# Patient Record
Sex: Female | Born: 1965 | Hispanic: No | Smoking: Never smoker
Health system: Southern US, Community
[De-identification: ages and names within clinical notes are randomized; demographics above are authoritative.]

## PROBLEM LIST (undated history)

## (undated) DIAGNOSIS — R87629 Unspecified abnormal cytological findings in specimens from vagina: Secondary | ICD-10-CM

## (undated) DIAGNOSIS — G43909 Migraine, unspecified, not intractable, without status migrainosus: Secondary | ICD-10-CM

## (undated) DIAGNOSIS — E282 Polycystic ovarian syndrome: Secondary | ICD-10-CM

## (undated) HISTORY — PX: MENISCUS REPAIR: SHX5179

## (undated) HISTORY — DX: Migraine, unspecified, not intractable, without status migrainosus: G43.909

## (undated) HISTORY — PX: MOUTH SURGERY: SHX715

## (undated) HISTORY — DX: Unspecified abnormal cytological findings in specimens from vagina: R87.629

## (undated) HISTORY — DX: Polycystic ovarian syndrome: E28.2

## (undated) HISTORY — PX: LAPAROSCOPIC GASTRIC SLEEVE RESECTION: SHX5895

---

## 2017-05-31 ENCOUNTER — Other Ambulatory Visit: Payer: Self-pay | Admitting: Specialist

## 2017-05-31 DIAGNOSIS — Z1239 Encounter for other screening for malignant neoplasm of breast: Secondary | ICD-10-CM

## 2017-06-09 ENCOUNTER — Ambulatory Visit: Payer: Self-pay

## 2017-12-30 ENCOUNTER — Ambulatory Visit (INDEPENDENT_AMBULATORY_CARE_PROVIDER_SITE_OTHER): Payer: BC Managed Care – PPO | Admitting: Obstetrics & Gynecology

## 2017-12-30 ENCOUNTER — Encounter: Payer: Self-pay | Admitting: Obstetrics & Gynecology

## 2017-12-30 VITALS — BP 114/65 | HR 81 | Resp 16 | Ht 64.0 in | Wt 226.0 lb

## 2017-12-30 DIAGNOSIS — Z01419 Encounter for gynecological examination (general) (routine) without abnormal findings: Secondary | ICD-10-CM

## 2017-12-30 DIAGNOSIS — N938 Other specified abnormal uterine and vaginal bleeding: Secondary | ICD-10-CM

## 2017-12-30 DIAGNOSIS — Z1151 Encounter for screening for human papillomavirus (HPV): Secondary | ICD-10-CM

## 2017-12-30 DIAGNOSIS — Z124 Encounter for screening for malignant neoplasm of cervix: Secondary | ICD-10-CM

## 2017-12-30 MED ORDER — IBUPROFEN 800 MG PO TABS: 800.0000 mg | ORAL_TABLET | Freq: Three times a day (TID) | ORAL | 1 refills | Status: DC | PRN

## 2017-12-30 NOTE — Progress Notes (Signed)
Subjective:    Valerie Glover is a 52 y.o. married P0 female who presents for an annual exam. She has PCOS and is having painful cramping all month long. She still has periods but they are random.She is having them monthly but she is having some amount of bleeding most every day.  The patient is not currently sexually active, abstinent for about a year. GYN screening history: last pap: was normal. The patient wears seatbelts: yes. The patient participates in regular exercise: yes. Has the patient ever been transfused or tattooed?: no. The patient reports that there is not domestic violence in her life.   Menstrual History: OB History    Gravida  0   Para  0   Term  0   Preterm  0   AB  0   Living  0     SAB  0   TAB  0   Ectopic  0   Multiple  0   Live Births  0           Menarche age: 61 Patient's last menstrual period was 12/23/2017.    The following portions of the patient's history were reviewed and updated as appropriate: allergies, current medications, past family history, past medical history, past social history, past surgical history and problem list.  Review of Systems Pertinent items are noted in HPI.   Needs colon screening, plans to discuss cologard with her fam med doc Married for 27 years Works as a Runner, broadcasting/film/video at [employer], kindergarten Mammogram due this summer   Objective:    BP 114/65   Pulse 81   Resp 16   Ht  (1.626 m)   Wt 226 lb (102.5 kg)   LMP 12/23/2017   BMI 38.79 kg/m   General Appearance:    Alert, cooperative, no distress, appears stated age  Head:    Normocephalic, without obvious abnormality, atraumatic  Eyes:    PERRL, conjunctiva/corneas clear, EOM's intact, fundi    benign, both eyes  Ears:    Normal TM's and external ear canals, both ears  Nose:   Nares normal, septum midline, mucosa normal, no drainage    or sinus tenderness  Throat:   Lips, mucosa, and tongue normal; teeth and gums normal  Neck:   Supple,  symmetrical, trachea midline, no adenopathy;    thyroid:  no enlargement/tenderness/nodules; no carotid   bruit or JVD  Back:     Symmetric, no curvature, ROM normal, no CVA tenderness  Lungs:     Clear to auscultation bilaterally, respirations unlabored  Chest Wall:    No tenderness or deformity   Heart:    Regular rate and rhythm, S1 and S2 normal, no murmur, rub   or gallop  Breast Exam:    No tenderness, masses, or nipple abnormality  Abdomen:     Soft, non-tender, bowel sounds active all four quadrants,    no masses, no organomegaly  Genitalia:    Normal female without lesion, discharge or tenderness, normal size and shape, anteverted, mobile, non-tender, normal adnexal exam      Extremities:   Extremities normal, atraumatic, no cyanosis or edema  Pulses:   2+ and symmetric all extremities  Skin:   Skin color, texture, turgor normal, no rashes or lesions  Lymph nodes:   Cervical, supraclavicular, and axillary nodes normal  Neurologic:   CNII-XII intact, normal strength, sensation and reflexes    throughout  .    Assessment:    Healthy female exam.  DUB Pelvic pain   Plan:     Thin prep Pap smear. with cotesting IBU 800 mg q4 hours Gyn u/s Come back after u/s

## 2017-12-31 LAB — TSH: TSH: 3.74 mIU/L

## 2018-01-04 LAB — CYTOLOGY - PAP
BACTERIAL VAGINITIS: NEGATIVE
CANDIDA VAGINITIS: NEGATIVE
DIAGNOSIS: NEGATIVE
HPV: NOT DETECTED

## 2018-01-25 ENCOUNTER — Other Ambulatory Visit: Payer: Self-pay

## 2018-01-31 ENCOUNTER — Ambulatory Visit: Payer: Self-pay | Admitting: Obstetrics & Gynecology

## 2018-02-18 ENCOUNTER — Encounter: Payer: Self-pay | Admitting: Obstetrics & Gynecology

## 2018-02-18 ENCOUNTER — Ambulatory Visit (INDEPENDENT_AMBULATORY_CARE_PROVIDER_SITE_OTHER): Payer: BC Managed Care – PPO

## 2018-02-18 DIAGNOSIS — N938 Other specified abnormal uterine and vaginal bleeding: Secondary | ICD-10-CM

## 2018-02-21 ENCOUNTER — Encounter: Payer: Self-pay | Admitting: Obstetrics & Gynecology

## 2018-02-23 ENCOUNTER — Encounter: Payer: Self-pay | Admitting: Obstetrics & Gynecology

## 2018-02-24 ENCOUNTER — Encounter: Payer: Self-pay | Admitting: Obstetrics & Gynecology

## 2018-02-24 ENCOUNTER — Ambulatory Visit: Payer: BC Managed Care – PPO | Admitting: Obstetrics & Gynecology

## 2018-02-24 VITALS — BP 126/78 | HR 66 | Ht 64.0 in | Wt 229.0 lb

## 2018-02-24 DIAGNOSIS — Z01812 Encounter for preprocedural laboratory examination: Secondary | ICD-10-CM

## 2018-02-24 DIAGNOSIS — Z3202 Encounter for pregnancy test, result negative: Secondary | ICD-10-CM | POA: Diagnosis not present

## 2018-02-24 LAB — POCT URINE PREGNANCY: Preg Test, Ur: NEGATIVE

## 2018-02-24 MED ORDER — MISOPROSTOL 200 MCG PO TABS: ORAL_TABLET | ORAL | 0 refills | Status: AC

## 2018-02-25 ENCOUNTER — Encounter (HOSPITAL_COMMUNITY): Payer: Self-pay

## 2018-02-28 ENCOUNTER — Encounter: Payer: Self-pay | Admitting: Obstetrics & Gynecology

## 2018-02-28 ENCOUNTER — Encounter: Payer: Self-pay | Admitting: Gastroenterology

## 2018-03-07 ENCOUNTER — Encounter (HOSPITAL_COMMUNITY): Payer: Self-pay

## 2018-03-11 ENCOUNTER — Encounter (HOSPITAL_COMMUNITY): Payer: BC Managed Care – PPO

## 2018-03-16 ENCOUNTER — Encounter: Payer: Self-pay | Admitting: Obstetrics & Gynecology

## 2018-03-22 ENCOUNTER — Ambulatory Visit (HOSPITAL_COMMUNITY)
Admission: RE | Admit: 2018-03-22 | Payer: BC Managed Care – PPO | Source: Ambulatory Visit | Admitting: Obstetrics & Gynecology

## 2018-03-22 SURGERY — DILATION AND CURETTAGE
Anesthesia: Choice

## 2018-03-23 ENCOUNTER — Other Ambulatory Visit: Payer: Self-pay | Admitting: Obstetrics & Gynecology

## 2018-03-23 MED ORDER — MEFENAMIC ACID 250 MG PO CAPS: ORAL_CAPSULE | ORAL | 3 refills | Status: DC

## 2018-03-23 MED ORDER — DESOGESTREL-ETHINYL ESTRADIOL 0.15-0.02/0.01 MG (21/5) PO TABS: 1.0000 | ORAL_TABLET | Freq: Every day | ORAL | 11 refills | Status: DC

## 2018-03-23 NOTE — Telephone Encounter (Signed)
She called because she is having bloating, sore breasts, not sexually active. She has used OCPs in the past. She is also requesting mefanicic acid (refill given) She mentioned also a hysterectomy and I told her that I didn't believe that was a good answer.  Rec make appt in 3 months.

## 2018-03-29 ENCOUNTER — Ambulatory Visit: Payer: BC Managed Care – PPO | Admitting: Obstetrics & Gynecology

## 2018-04-15 ENCOUNTER — Encounter (HOSPITAL_COMMUNITY): Payer: Self-pay

## 2018-04-29 ENCOUNTER — Encounter: Payer: BC Managed Care – PPO | Admitting: Gastroenterology

## 2018-05-04 ENCOUNTER — Telehealth: Payer: Self-pay

## 2018-05-04 DIAGNOSIS — IMO0001 Reserved for inherently not codable concepts without codable children: Secondary | ICD-10-CM

## 2018-05-04 MED ORDER — NORETHIN-ETH ESTRAD-FE BIPHAS 1 MG-10 MCG / 10 MCG PO TABS: 1.0000 | ORAL_TABLET | Freq: Every day | ORAL | 7 refills | Status: DC

## 2018-05-04 NOTE — Telephone Encounter (Signed)
Left message on pt's phone letting her know that Dr.Dove said it was okay for her to have Lo Loestrin sent it and that I sent it to [facility] on [address] in [place].

## 2018-05-15 ENCOUNTER — Other Ambulatory Visit: Payer: Self-pay | Admitting: Obstetrics & Gynecology

## 2018-05-30 ENCOUNTER — Other Ambulatory Visit: Payer: Self-pay | Admitting: Obstetrics & Gynecology

## 2018-07-05 ENCOUNTER — Ambulatory Visit: Admit: 2018-07-05 | Payer: BC Managed Care – PPO | Admitting: Obstetrics & Gynecology

## 2018-07-05 SURGERY — DILATION AND CURETTAGE
Anesthesia: Choice

## 2018-08-22 IMAGING — US US TRANSVAGINAL NON-OB
1 series · 14 of 25 positions shown · non-contrast
Comparison: None

CLINICAL DATA: Dysfunctional uterine bleeding, intermittent lower
abdominal cramping, bloating and vaginal discharge for 1 year; LMP
12/23/2017, history PCOS

EXAM:
ULTRASOUND PELVIS TRANSVAGINAL
TECHNIQUE: Transvaginal ultrasound examination of the pelvis was performed
including evaluation of the uterus, ovaries, adnexal regions, and
pelvic cul-de-sac. Transabdominal imaging was not ordered.

[Series 1: us transvaginal non-ob · 0.12mm/px · 14 of 40 slices shown]
[im 1/40]
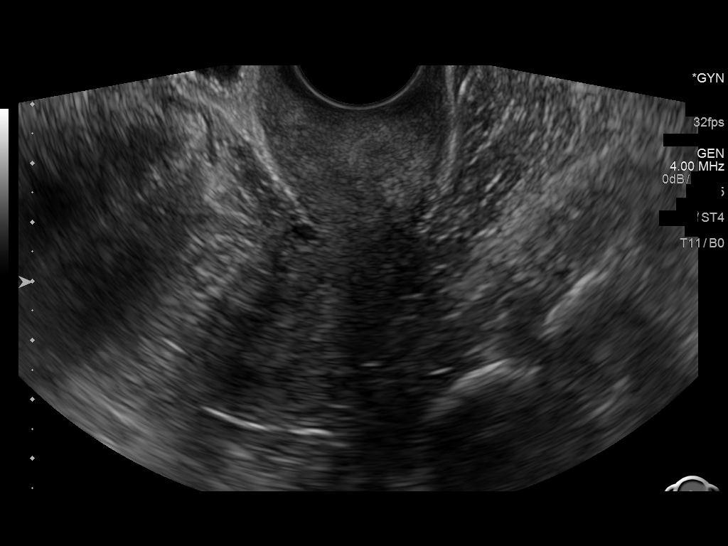
[im 4/40]
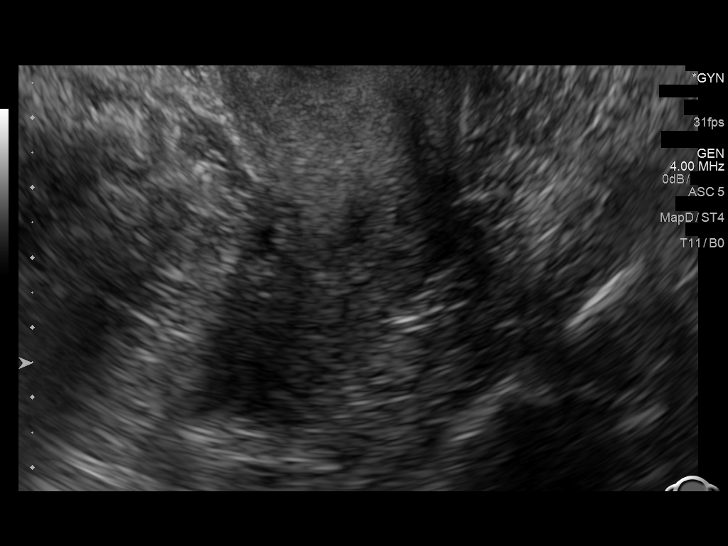
[im 7/40]
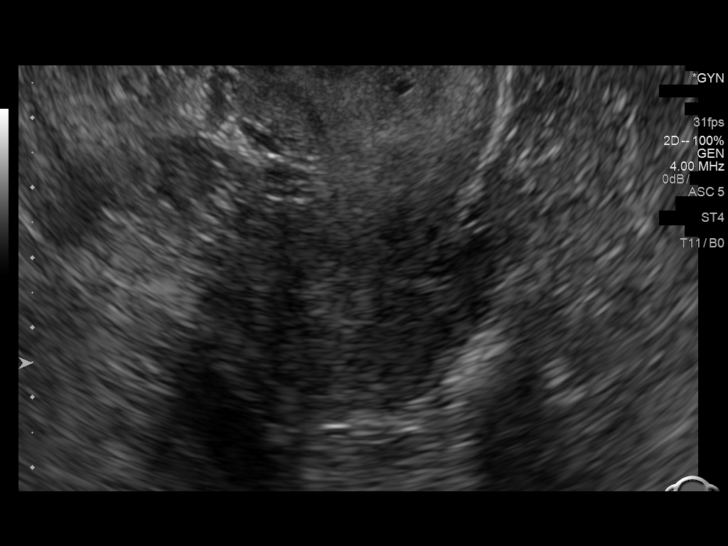
[im 10/40]
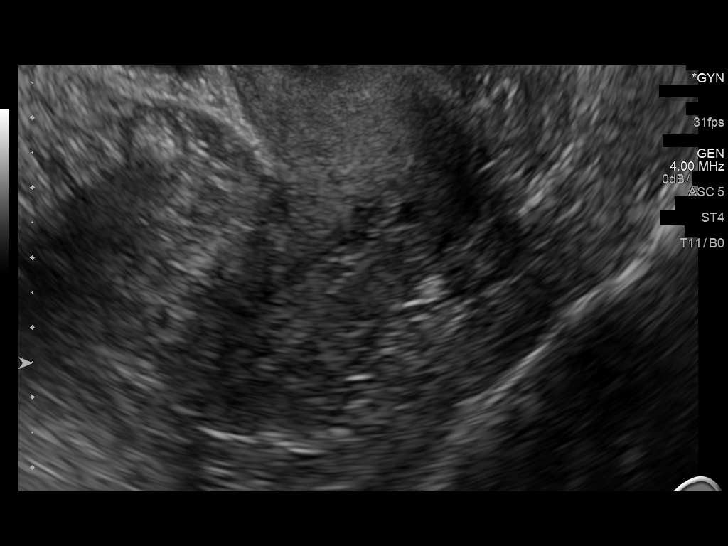
[im 14/40]
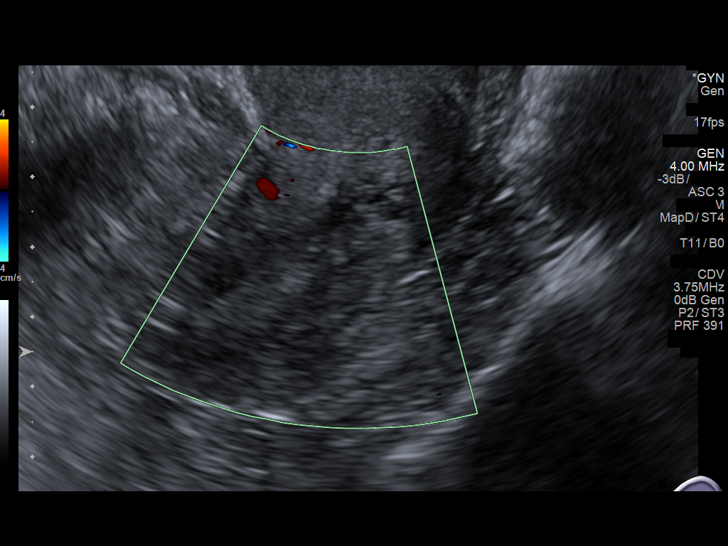
[im 15/40]
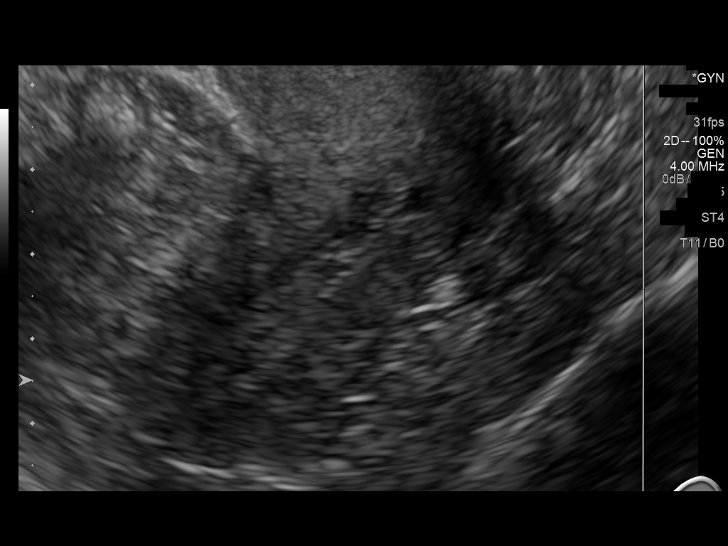
[im 18/40]
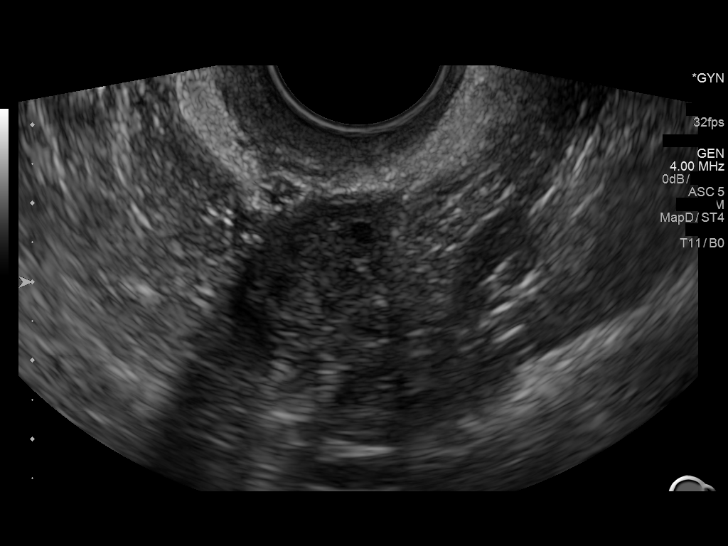
[im 22/40]
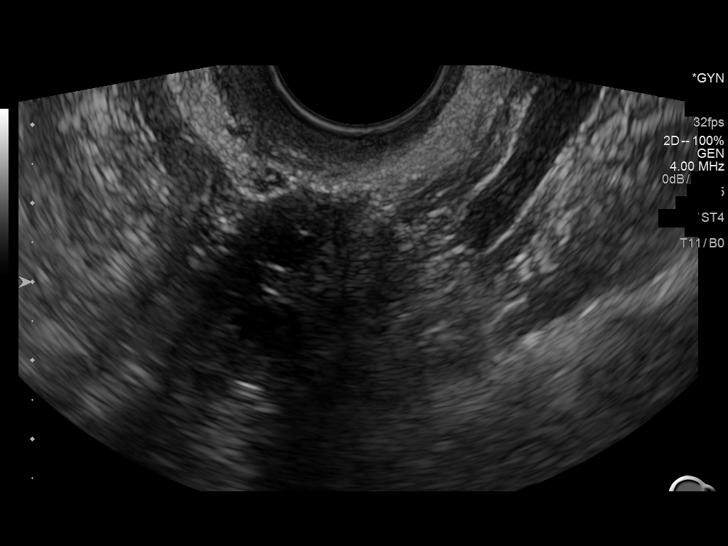
[im 25/40]
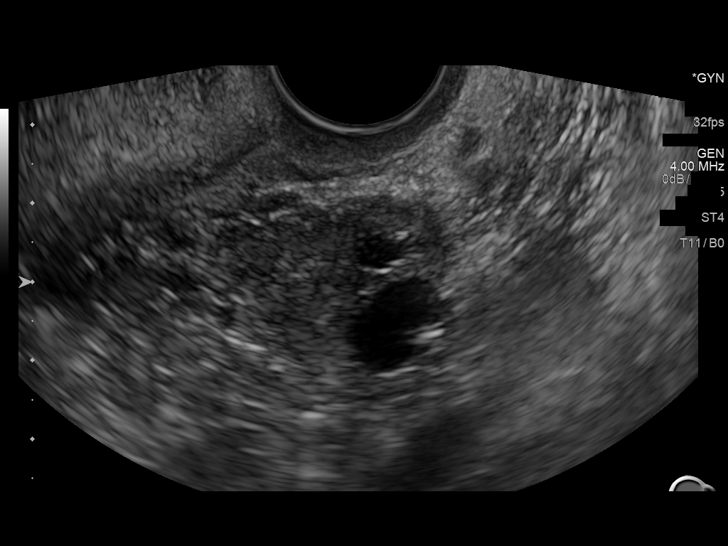
[im 27/40]
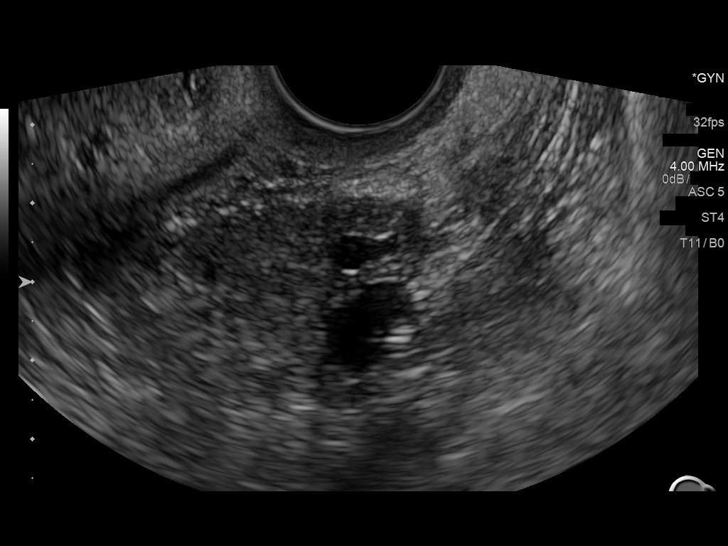
[im 30/40]
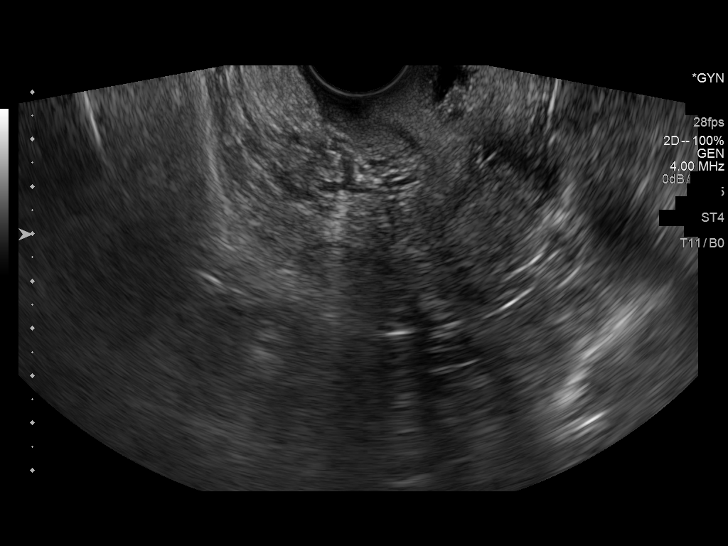
[im 33/40]
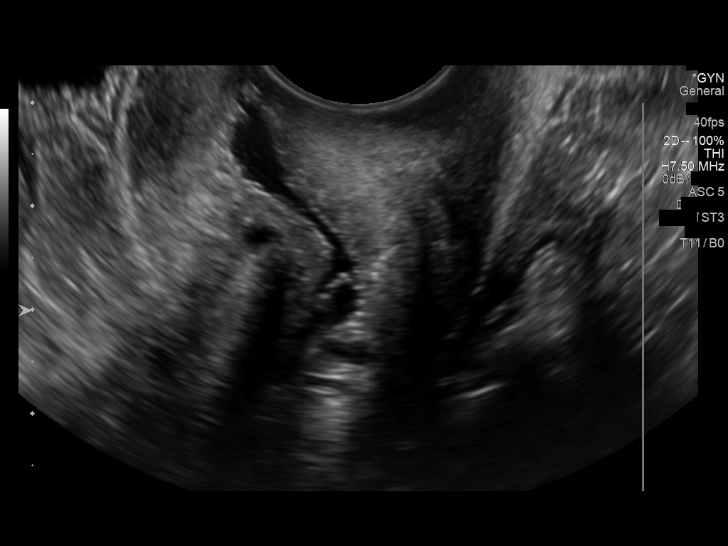
[im 36/40]
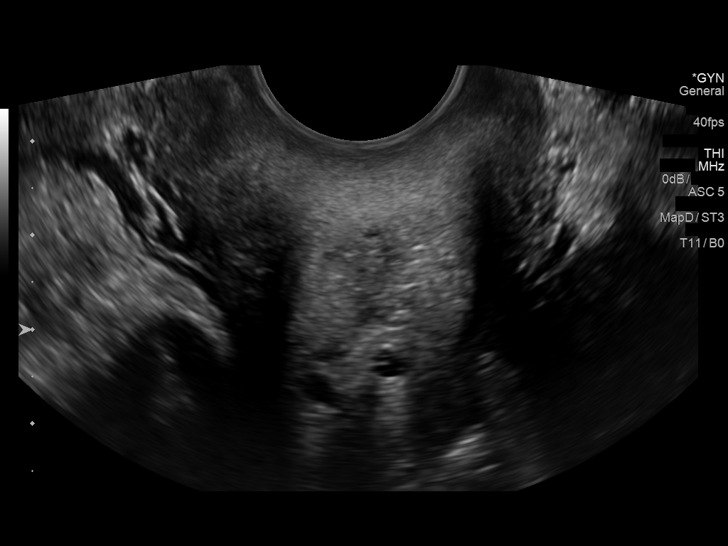
[im 40/40]
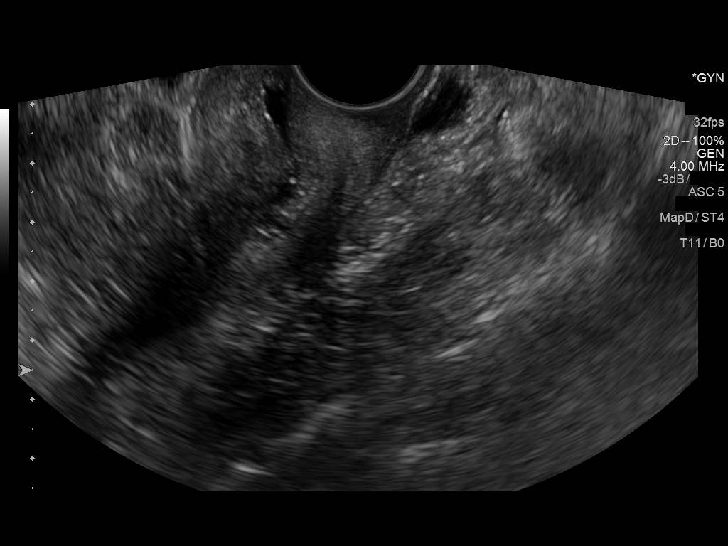

[14 of 25 positions shown; findings below may reference images not displayed]

FINDINGS: Uterus

Measurements: 6.0 x 2.7 x 3.9 cm. Fundus suboptimally visualized due
to position. No gross evidence of uterine mass

Endometrium

Thickness: 8 mm thick. Mildly heterogeneous without definite
endometrial fluid or focal abnormality. Endocervical fluid is noted.

Right ovary

Not visualized question obscured due to obscuration by bowel versus
high position.

Left ovary

Measurements: 2.5 x 1.4 x 3.3 cm.  Normal morphology without mass

Other findings:  No adnexal masses.  Trace free pelvic fluid.
IMPRESSION: Nonspecific endocervical fluid.

Nonvisualization of RIGHT ovary.

Otherwise negative exam.

## 2021-02-07 HISTORY — PX: JOINT REPLACEMENT: SHX530

## 2021-11-08 ENCOUNTER — Emergency Department (INDEPENDENT_AMBULATORY_CARE_PROVIDER_SITE_OTHER)
Admission: EM | Admit: 2021-11-08 | Discharge: 2021-11-08 | Disposition: A | Discharge status: Home / Self Care | Payer: BC Managed Care – PPO | Source: Home / Self Care | Attending: Family Medicine | Admitting: Family Medicine

## 2021-11-08 ENCOUNTER — Encounter: Payer: Self-pay | Admitting: Emergency Medicine

## 2021-11-08 DIAGNOSIS — G43719 Chronic migraine without aura, intractable, without status migrainosus: Secondary | ICD-10-CM

## 2021-11-08 MED ORDER — KETOROLAC TROMETHAMINE 60 MG/2ML IM SOLN
60.0000 mg | Freq: Once | INTRAMUSCULAR | Status: AC
  Administered 2021-11-08: 60 mg via INTRAMUSCULAR

## 2021-11-08 MED ORDER — OXYCODONE-ACETAMINOPHEN 5-325 MG PO TABS
1.0000 | ORAL_TABLET | Freq: Four times a day (QID) | ORAL | 0 refills | Status: AC | PRN
Start: 2021-11-08 — End: ?

## 2021-11-08 NOTE — ED Notes (Signed)
Pt requesting a toradol shot at d/c. RN waiting on availability of provider to review. Pt & husband aware ?

## 2021-11-08 NOTE — ED Triage Notes (Signed)
Hx of migraines  ?Day 20 of HA  ?No  meds today  ?Has spoken with her neurologist  ?Here w/ her husband  ?Denies recent illness ?

## 2021-11-08 NOTE — ED Provider Notes (Signed)
?[facility] ? ? ? ?CSN: [id number] ?Arrival date & time: 11/08/21  1057 ? ? ?  ? ?History   ?Chief Complaint ?Chief Complaint  ?Patient presents with  ? Migraine  ? ? ?HPI ?Valerie Glover is a 56 y.o. female.  ? ?HPI ? ?56 year old woman with a history of chronic migraines.  Has seen neurology a multitude of times over the years.  Is not responding to her current migraine treatments.  She is on the Aimovig injections, has Phenergan for nausea and vomiting, has Imitrex to take for headaches, Topamax to take to prevent headaches, and Zanaflex to take for tension.  In spite of this she has a migraine that is been going on for almost 2 weeks.  She saw her neurologist on 10/29/2021 and was given an injection of Toradol, an injection of Zofran and an injection of Decadron with steroids to take for a few days.  This helped the headache but did not make it go away.  The headache is back today.  She is here with her husband ? ?Past Medical History:  ?Diagnosis Date  ? Migraines   ? PCO (polycystic ovaries)   ? Vaginal Pap smear, abnormal   ? ? ?There are no problems to display for this patient. ? ? ?Past Surgical History:  ?Procedure Laterality Date  ? JOINT REPLACEMENT Left 02/2021  ? LAPAROSCOPIC GASTRIC SLEEVE RESECTION    ? MENISCUS REPAIR    ? MOUTH SURGERY    ? ? ?OB History   ? ? Gravida  ?0  ? Para  ?0  ? Term  ?0  ? Preterm  ?0  ? AB  ?0  ? Living  ?0  ?  ? ? SAB  ?0  ? IAB  ?0  ? Ectopic  ?0  ? Multiple  ?0  ? Live Births  ?0  ?   ?  ?  ? ? ? ?Home Medications   ? ?Prior to Admission medications   ?Medication Sig Start Date End Date Taking? Authorizing Provider  ?Cholecalciferol 50 MCG (2000 UT) CAPS PLEASE SEE ATTACHED FOR DETAILED DIRECTIONS 01/21/21  Yes [provider]  ?promethazine (PHENERGAN) 25 MG tablet Take by mouth. 07/21/21  Yes [provider]  ?tiZANidine (ZANAFLEX) 4 MG tablet Take by mouth 3 (three) times daily. 12/06/20  Yes [provider]  ?AIMOVIG 140 DOSE 70  MG/ML SOAJ INJECT 2 MLS (140 MG TOTAL) INTO THE SKIN EVERY 30 DAYS. 12/06/17   [provider]  ?divalproex (DEPAKOTE) 500 MG DR tablet Take 500 mg by mouth 3 (three) times daily.    [provider]  ?escitalopram (LEXAPRO) 10 MG tablet Take 10 mg by mouth daily.    [provider]  ?misoprostol (CYTOTEC) 200 MCG tablet Take 3 pills by mouth the night before biopsy. 02/24/18   Allie Bossier, MD  ?oxyCODONE-acetaminophen (PERCOCET/ROXICET) 5-325 MG tablet Take 1-2 tablets by mouth every 6 (six) hours as needed for severe pain. 11/08/21  Yes Eustace Moore, MD  ?QUEtiapine (SEROQUEL) 25 MG tablet Take 25 mg by mouth as needed. 10/30/21   [provider]  ?spironolactone (ALDACTONE) 50 MG tablet TAKE 2 TABLETS (100 MG TOTAL) BY MOUTH 2 TIMES DAILY. 06/06/18   Allie Bossier, MD  ?SUMAtriptan (IMITREX) 100 MG tablet TAKE 1 TABLET BY MOUTH AT ONSET OF HEADACHE. MAY REPEAT IN TWO HOURS IF NEEDED. MAX 2TABS/24HRS. 11/05/17   [provider]  ?topiramate (TOPAMAX) 100 MG tablet TAKE 1.5 TABLETS (  150 MG TOTAL) BY MOUTH 2 TIMES DAILY. 12/09/17   [provider]  ? ? ?Family History ?Family History  ?Problem Relation Age of Onset  ? Multiple sclerosis Mother   ? Healthy Father   ? Stroke Paternal Grandfather   ? Breast cancer Maternal Aunt   ? ? ?Social History ?Social History  ? ?Tobacco Use  ? Smoking status: Never  ? Smokeless tobacco: Never  ?Vaping Use  ? Vaping Use: Never used  ?Substance Use Topics  ? Alcohol use: Not Currently  ? Drug use: Never  ? ? ? ?Allergies   ?Sulfa antibiotics ? ? ?Review of Systems ?Review of Systems ? ? ?Physical Exam ?Triage Vital Signs ?ED Triage Vitals  ?Enc Vitals Group  ?   BP --   ?   Pulse Rate 11/08/21 1224 77  ?   Resp 11/08/21 1224 17  ?   Temp 11/08/21 1224 99 ?F (37.2 ?C)  ?   Temp Source 11/08/21 1224 Oral  ?   SpO2 11/08/21 1224 98 %  ?   Weight 11/08/21 1226 213 lb (96.6 kg)  ?   Height 11/08/21 1226 5\' 4"  (1.626 m)  ?   Head  Circumference --   ?   Peak Flow --   ?   Pain Score 11/08/21 1226 10  ?   Pain Loc --   ?   Pain Edu? --   ?   Excl. in GC? --   ? ?No data found. ? ?Updated Vital Signs ?Pulse 77   Temp 99 ?F (37.2 ?C) (Oral)   Resp 17   Ht 5\' 4"  (1.626 m)   Wt 96.6 kg   LMP 12/23/2017   SpO2 98%   BMI 36.56 kg/m?    ? ?Physical Exam ?Constitutional:   ?   General: She is not in acute distress. ?   Appearance: She is well-developed. She is ill-appearing.  ?HENT:  ?   Head: Normocephalic and atraumatic.  ?Eyes:  ?   Conjunctiva/sclera: Conjunctivae normal.  ?   Pupils: Pupils are equal, round, and reactive to light.  ?Cardiovascular:  ?   Rate and Rhythm: Normal rate.  ?Pulmonary:  ?   Effort: Pulmonary effort is normal. No respiratory distress.  ?Abdominal:  ?   General: There is no distension.  ?   Palpations: Abdomen is soft.  ?Musculoskeletal:     ?   General: Normal range of motion.  ?   Cervical back: Normal range of motion.  ?Skin: ?   General: Skin is warm and dry.  ?Neurological:  ?   General: No focal deficit present.  ?   Mental Status: She is alert.  ?Psychiatric:     ?   Mood and Affect: Mood normal.     ?   Behavior: Behavior normal.  ? ? ? ?UC Treatments / Results  ?Labs ?(all labs ordered are listed, but only abnormal results are displayed) ?Labs Reviewed - No data to display ? ?EKG ? ? ?Radiology ?No results found. ? ?Procedures ?Procedures (including critical care time) ? ?Medications Ordered in UC ?Medications - No data to display ? ?Initial Impression / Assessment and Plan / UC Course  ?I have reviewed the triage vital signs and the nursing notes. ? ?Pertinent labs & imaging results that were available during my care of the patient were reviewed by me and considered in my medical decision making (see chart for details). ? ?  ? ?Although not usually recommended for  chronic migraines, this is an acute migraine that is not responding to usual medication.  I am giving her a small number of narcotic medication  to help with her pain.  A Toradol shot today.  I am not can follow this with steroids since it did not work the last time she used them.  Follow-up with neurology ?Final Clinical Impressions(s) / UC Diagnoses  ? ?Final diagnoses:  ?Intractable chronic migraine without aura and without status migrainosus  ? ? ? ?Discharge Instructions   ? ?  ?Take Phenergan for nausea ?Take pain medicine Percocet (oxycodone) if needed.  Although generally not the best medicine for migraines, it may help with your pain and allow you to rest ?Call your neurologist Monday to discuss referral to migraine specialty clinic ? ? ?ED Prescriptions   ? ? Medication Sig Dispense Auth. Provider  ? oxyCODONE-acetaminophen (PERCOCET/ROXICET) 5-325 MG tablet Take 1-2 tablets by mouth every 6 (six) hours as needed for severe pain. 10 tablet Eustace MooreNelson, Elivia Robotham Sue, MD  ? ?  ? ?I have reviewed the PDMP during this encounter. ?  ?Eustace MooreNelson, Kenzie Thoreson Sue, MD ?11/08/21 1355 ? ?

## 2021-11-08 NOTE — Discharge Instructions (Addendum)
Take Phenergan for nausea ?Take pain medicine Percocet (oxycodone) if needed.  Although generally not the best medicine for migraines, it may help with your pain and allow you to rest ?Call your neurologist Monday to discuss referral to migraine specialty clinic ?

## 2022-05-13 ENCOUNTER — Encounter: Payer: BC Managed Care – PPO | Admitting: Obstetrics & Gynecology

## 2022-05-18 ENCOUNTER — Encounter: Payer: BC Managed Care – PPO | Admitting: Obstetrics & Gynecology
